# Patient Record
Sex: Male | Born: 1980 | Race: Black or African American | Hispanic: No | Marital: Single | Smoking: Light tobacco smoker
Health system: Southern US, Community
[De-identification: ages and names within clinical notes are randomized; demographics above are authoritative.]

---

## 2004-04-23 ENCOUNTER — Emergency Department (HOSPITAL_COMMUNITY): Admission: EM | Admit: 2004-04-23 | Discharge: 2004-04-23 | Payer: Self-pay | Admitting: Emergency Medicine

## 2005-05-30 ENCOUNTER — Emergency Department: Payer: Self-pay | Admitting: Emergency Medicine

## 2005-05-30 IMAGING — CR DG KNEE 1-2V*R*
2 series · 2 of 2 positions shown · non-contrast
Comparison: none

CLINICAL DATA: Right knee pain. 
 TWO VIEW, RIGHT KNEE 
 There is calcification in the medial joint space.  There is slight irregularity involving the medial femoral condyle.  It may be an osteochondral lesion with loose bodies.  It is possible that it could be meniscal calcifications but I think that is unlikely.  The patient has a small joint effusion.   No acute bony findings.  
 IMPRESSION
 Calcification in the medial joint space may be due to a loose osteochondral lesion.  There is a joint effusion but no acute bony findings.  MR Donta be helpful for further evaluation of these findings.

[view not recorded (1 of 2)]
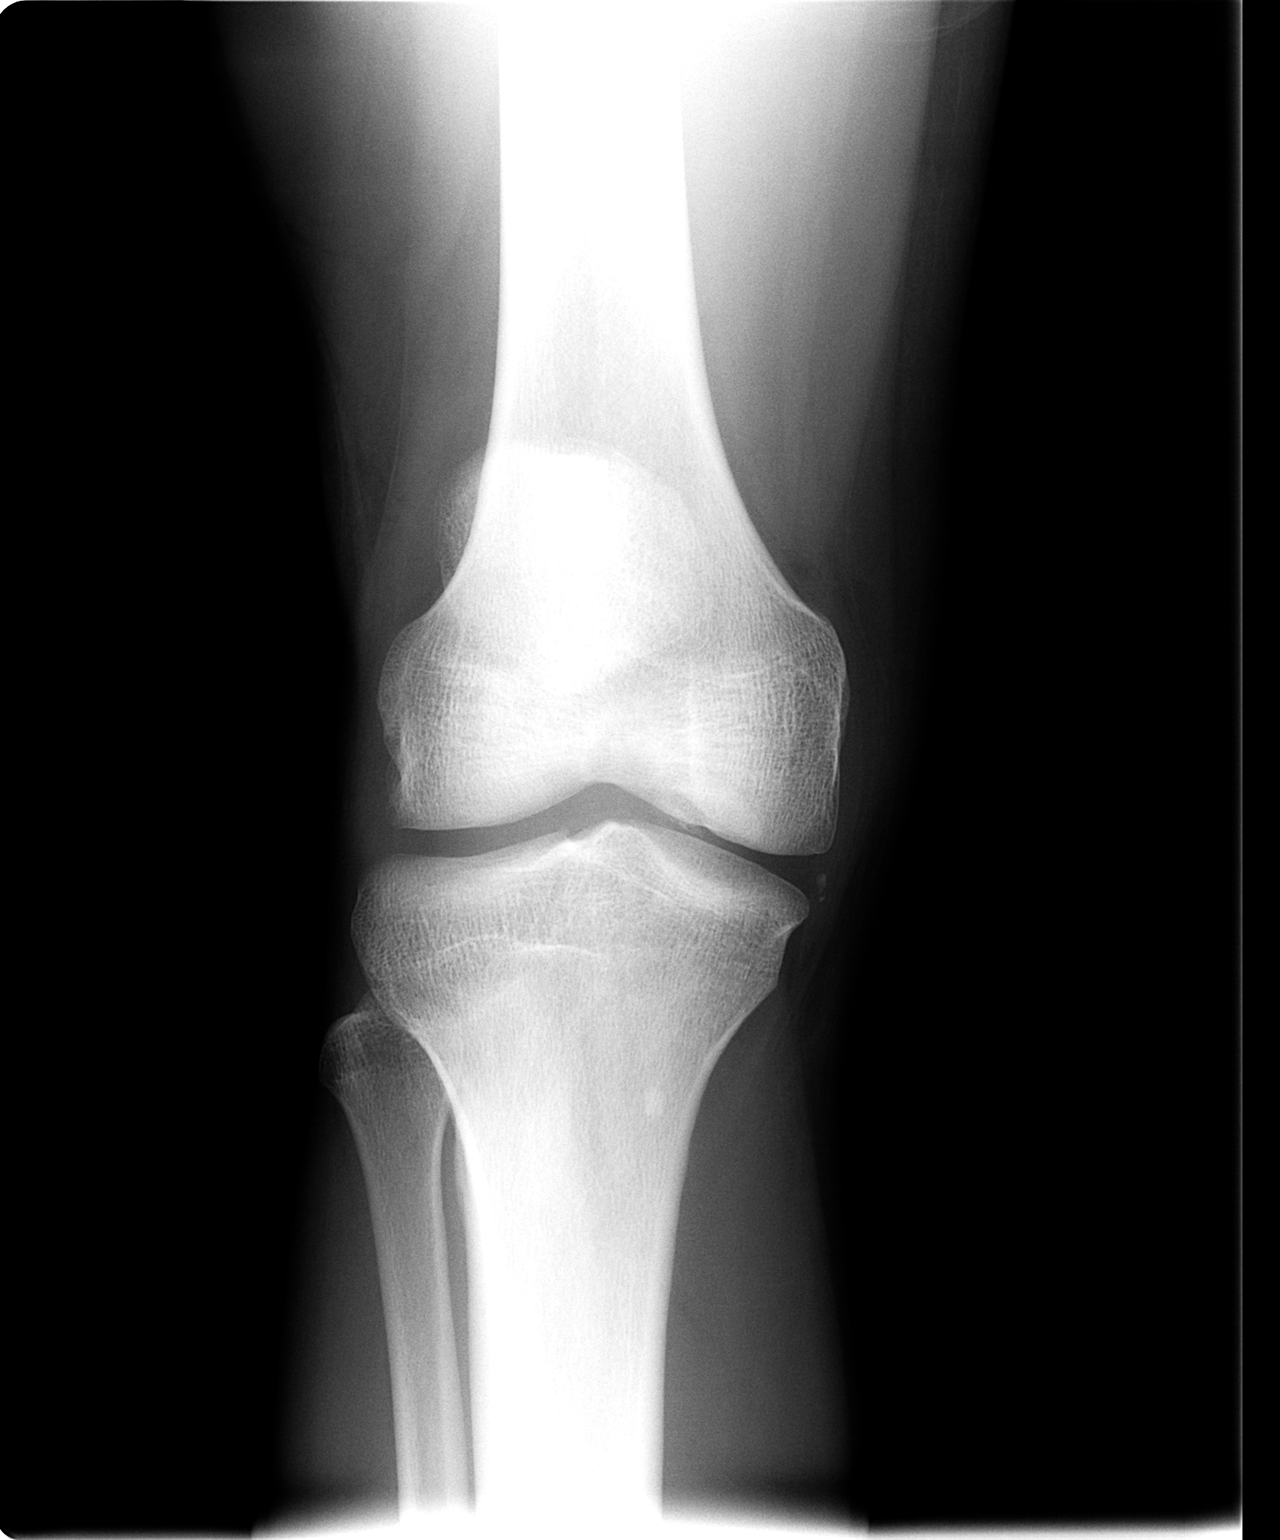

[view not recorded (2 of 2)]
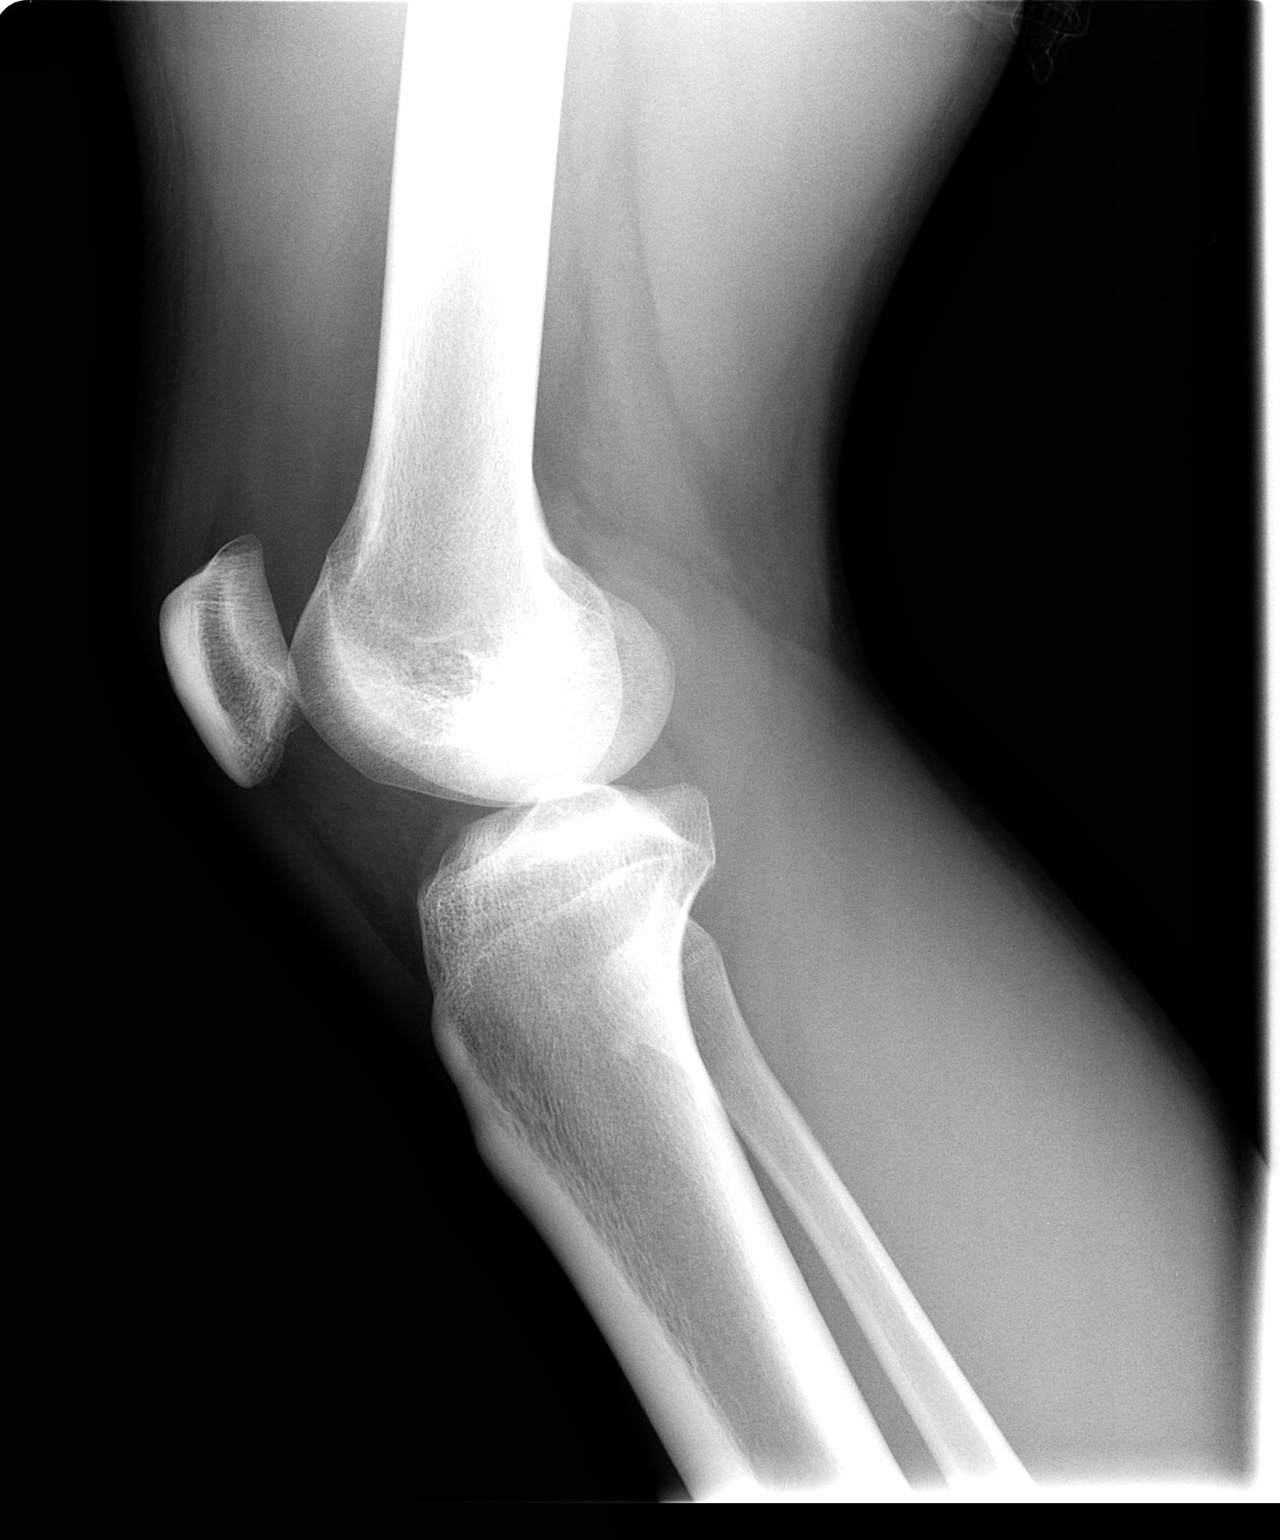

[2 of 2 positions shown; findings below may reference images not displayed]

## 2007-04-10 ENCOUNTER — Emergency Department (HOSPITAL_COMMUNITY): Admission: EM | Admit: 2007-04-10 | Discharge: 2007-04-10 | Payer: Self-pay | Admitting: Family Medicine

## 2008-04-05 ENCOUNTER — Emergency Department: Payer: Self-pay | Admitting: Emergency Medicine

## 2008-08-01 ENCOUNTER — Emergency Department: Payer: Self-pay | Admitting: Emergency Medicine

## 2008-09-12 ENCOUNTER — Emergency Department: Payer: Self-pay | Admitting: Internal Medicine

## 2009-02-28 ENCOUNTER — Emergency Department: Payer: Self-pay | Admitting: Unknown Physician Specialty

## 2019-10-21 ENCOUNTER — Encounter (HOSPITAL_COMMUNITY): Payer: Self-pay

## 2019-10-21 ENCOUNTER — Ambulatory Visit (HOSPITAL_COMMUNITY)
Admission: EM | Admit: 2019-10-21 | Discharge: 2019-10-21 | Disposition: A | Discharge status: Home / Self Care | Payer: Medicaid Other | Attending: Family Medicine | Admitting: Family Medicine

## 2019-10-21 ENCOUNTER — Other Ambulatory Visit: Payer: Self-pay

## 2019-10-21 DIAGNOSIS — Z20822 Contact with and (suspected) exposure to covid-19: Secondary | ICD-10-CM

## 2019-10-21 DIAGNOSIS — R030 Elevated blood-pressure reading, without diagnosis of hypertension: Secondary | ICD-10-CM

## 2019-10-21 DIAGNOSIS — R109 Unspecified abdominal pain: Secondary | ICD-10-CM

## 2019-10-21 DIAGNOSIS — R519 Headache, unspecified: Secondary | ICD-10-CM

## 2019-10-21 NOTE — ED Provider Notes (Signed)
[facility]    CSN: [id number] Arrival date & time: 10/21/19  0815      History   Chief Complaint Chief Complaint  Patient presents with  . Headache  . Abdominal Pain    HPI Joshua Benton is a 39 y.o. male.   HPI  Patient states he woke up this morning with headache and some crampy abdominal pain.  Decreased appetite.  No coughing or shortness of breath.  No fever or chills.  He feels slightly tired.  No body aches.  He states that a coworker was positive for Covid.  He is here for Covid testing.  History reviewed. No pertinent past medical history.  There are no problems to display for this patient.   History reviewed. No pertinent surgical history.     Home Medications    Prior to Admission medications   Not on File    Family History Family History  Family history unknown: Yes    Social History Social History   Tobacco Use  . Smoking status: Light Tobacco Smoker  Substance Use Topics  . Alcohol use: Not on file  . Drug use: Not on file     Allergies   Patient has no known allergies.   Review of Systems Review of Systems  Constitutional: Positive for appetite change and fatigue.  HENT: Negative for congestion and sore throat.   Respiratory: Negative for cough and shortness of breath.   Gastrointestinal: Positive for abdominal pain.  Musculoskeletal: Negative for myalgias.  Neurological: Positive for headaches.     Physical Exam Triage Vital Signs ED Triage Vitals [10/21/19 0853]  Enc Vitals Group     BP (!) 150/99     Pulse Rate 73     Resp 18     Temp 98.3 F (36.8 C)     Temp Source Oral     SpO2 95 %     Weight      Height      Head Circumference      Peak Flow      Pain Score 5     Pain Loc      Pain Edu?      Excl. in Agency?    No data found.  Updated Vital Signs BP (!) 150/99 (BP Location: Right Arm)   Pulse 73   Temp 98.3 F (36.8 C) (Oral)   Resp 18   SpO2 95%      Physical Exam Constitutional:        General: He is not in acute distress.    Appearance: He is well-developed and normal weight.  HENT:     Head: Normocephalic and atraumatic.     Mouth/Throat:     Comments: Mask in place Eyes:     Conjunctiva/sclera: Conjunctivae normal.     Pupils: Pupils are equal, round, and reactive to light.  Cardiovascular:     Rate and Rhythm: Normal rate and regular rhythm.     Heart sounds: Normal heart sounds.  Pulmonary:     Effort: Pulmonary effort is normal. No respiratory distress.     Breath sounds: Normal breath sounds. No wheezing or rales.  Abdominal:     General: There is no distension.     Palpations: Abdomen is soft.  Musculoskeletal:        General: Normal range of motion.     Cervical back: Normal range of motion.  Skin:    General: Skin is warm and dry.  Neurological:  Mental Status: He is alert.  Psychiatric:        Mood and Affect: Mood normal.        Behavior: Behavior normal.      UC Treatments / Results  Labs (all labs ordered are listed, but only abnormal results are displayed) Labs Reviewed  NOVEL CORONAVIRUS, NAA (HOSP ORDER, SEND-OUT TO REF LAB; TAT 18-24 HRS)    EKG   Radiology No results found.  Procedures Procedures (including critical care time)  Medications Ordered in UC Medications - No data to display  Initial Impression / Assessment and Plan / UC Course  I have reviewed the triage vital signs and the nursing notes.  Pertinent labs & imaging results that were available during my care of the patient were reviewed by me and considered in my medical decision making (see chart for details).     Final Clinical Impressions(s) / UC Diagnoses   Final diagnoses:  Exposure to COVID-19 virus  Acute nonintractable headache, unspecified headache type  Elevated blood-pressure reading, without diagnosis of hypertension     Discharge Instructions     Go home to rest Drink plenty of fluids Take Tylenol for pain or fever You may take  over-the-counter cough and cold medicines as needed You must quarantine at home until your test result is available You can check for your test result in MyChart CALL for questions or problems You need to have your blood pressure rechecked when you are feeling better    ED Prescriptions    None     PDMP not reviewed this encounter.   Eustace Moore, MD 10/21/19 1013

## 2019-10-21 NOTE — Discharge Instructions (Addendum)
Go home to rest Drink plenty of fluids Take Tylenol for pain or fever You may take over-the-counter cough and cold medicines as needed You must quarantine at home until your test result is available You can check for your test result in MyChart CALL for questions or problems You need to have your blood pressure rechecked when you are feeling better

## 2019-10-21 NOTE — ED Triage Notes (Signed)
Pt presents with generalized abdominal pain and slight headache since waking up this morning.

## 2019-10-23 LAB — NOVEL CORONAVIRUS, NAA (HOSP ORDER, SEND-OUT TO REF LAB; TAT 18-24 HRS): SARS-CoV-2, NAA: NOT DETECTED

## 2020-01-21 ENCOUNTER — Other Ambulatory Visit: Payer: Self-pay

## 2020-01-21 DIAGNOSIS — Z20822 Contact with and (suspected) exposure to covid-19: Secondary | ICD-10-CM

## 2020-01-22 LAB — NOVEL CORONAVIRUS, NAA: SARS-CoV-2, NAA: NOT DETECTED

## 2020-01-22 LAB — SARS-COV-2, NAA 2 DAY TAT

## 2020-02-11 ENCOUNTER — Encounter: Payer: Self-pay | Admitting: *Deleted

## 2020-02-11 NOTE — Congregational Nurse Program (Signed)
  Dept: [place] Nurse Program Note  Date of Encounter: 02/11/2020 Met with patient and caseworker, Glennis Brink to discuss patient's desire to have a physical and he desires help with options  for seeking care. Patient states he has SSI and Medicaid.  Caseworker states he has been diagnosed with anxiety in the past.  Will refer  patient to behavioral health nurse for mental health resources. Past Medical Hist No past medical history on file.  Encounter Details: CNP Questionnaire - 02/11/20 0003      Questionnaire   Race  Black or African American    Location Patient Served At  [facility]    Uninsured  Not Applicable    Food  No food insecurities    Housing/Utilities  No permanent housing    Transportation  Yes, need transportation assistance    Interpersonal Safety  Yes, feel physically and emotionally safe where you currently live    Medication  Yes, have medication insecurities    Medical Provider  No    Referrals  Primary Care Provider/Clinic;Area Agency    ED Visit Averted  Not Applicable    Life-Saving Intervention Made  Not Applicable

## 2020-02-12 ENCOUNTER — Telehealth: Payer: Self-pay | Admitting: *Deleted

## 2020-02-12 NOTE — Telephone Encounter (Signed)
Phoned Joshua Benton, Asst. Coordinator of CN to request referrals to PCPs for patients  on Medicaid.  She stated that Medicaid has switched to a managed care program so the individual has to choose a specific plan and provider.   Writer will follow up with caseworker to assist patient with choice.Marland Kitchen

## 2020-02-12 NOTE — Telephone Encounter (Signed)
Contacted  Waynetta Sandy, RN to request she contact caseworker T. Sena Slate @ [facility] to set up  meeting with patient to discuss mental health resources.

## 2020-02-16 ENCOUNTER — Telehealth: Payer: Self-pay | Admitting: *Deleted

## 2020-02-16 ENCOUNTER — Encounter: Payer: Self-pay | Admitting: *Deleted

## 2020-02-16 NOTE — Telephone Encounter (Signed)
Contacted client with pick up information for June 3rd [facility] and reminded client to have three car seats available.

## 2020-02-16 NOTE — Telephone Encounter (Signed)
Contacted Restaurant manager, fast food and received authorization to request [employer].

## 2020-02-16 NOTE — Congregational Nurse Program (Signed)
  Dept: [place] Nurse Program Note  Date of Encounter: 02/16/2020  Past Medical History: No past medical history on file.  Encounter Details: CNP Questionnaire - 02/16/20 1415      Questionnaire   Patient Status  Not Applicable    Race  Black or African American    Location Patient Served At  [facility] [facility]    Uninsured  Not Applicable    Food  No food insecurities    Housing/Utilities  No permanent housing    Transportation  Yes, need transportation assistance    Interpersonal Safety  Yes, feel physically and emotionally safe where you currently live    Medication  Yes, have medication insecurities    Medical Provider  No    Referrals  Behavioral/Mental Health Provider   [facility]   ED Visit Averted  Not Applicable    Life-Saving Intervention Made  Not Applicable      Met with client and advocate Tiffany at [facility]. [facility] would [facility] help with stress and anxiety. Client is caring for three small children and staying in a shelter. Advocate plans to meet client and assist with June 3rd walk in at 9:00. Went to [facility] and picked up intake forms and taking to [facility] to have completed prior to arrival. Will work on setting up transportation.  Evvie Behrmann W. CN [phone]

## 2020-02-16 NOTE — Telephone Encounter (Signed)
Contacted [employer] and set up [employer] ride for June 3rd 9:00 walk in appointment to [facility].

## 2020-02-29 ENCOUNTER — Telehealth: Payer: Self-pay | Admitting: *Deleted

## 2020-02-29 NOTE — Telephone Encounter (Signed)
Telephone conversation with Hydrologist of CN Program who stated  client went for appointment at [facility] and failed to have car seats for 14, 89, and 39 year old.  Spoke with [employer] who will research options for  car seats and booster seats that can be available at the [facility] shelter for the future.

## 2020-03-14 ENCOUNTER — Telehealth: Payer: Self-pay | Admitting: *Deleted

## 2020-03-14 NOTE — Telephone Encounter (Signed)
CN Nurse spoke with Lilli Few, [facility] Shelter Director who states that patient is no longer a resident at shelter.  Ms. Sena Slate reports that patient is living with the mother of his children in [place].
